# Patient Record
Sex: Female | Born: 1980 | Race: White | Hispanic: No | Marital: Married | Smoking: Never smoker
Health system: Southern US, Community
[De-identification: ages and names within clinical notes are randomized; demographics above are authoritative.]

## PROBLEM LIST (undated history)

## (undated) DIAGNOSIS — F329 Major depressive disorder, single episode, unspecified: Secondary | ICD-10-CM

## (undated) DIAGNOSIS — I10 Essential (primary) hypertension: Secondary | ICD-10-CM

## (undated) DIAGNOSIS — F419 Anxiety disorder, unspecified: Secondary | ICD-10-CM

## (undated) DIAGNOSIS — F32A Depression, unspecified: Secondary | ICD-10-CM

## (undated) HISTORY — DX: Depression, unspecified: F32.A

## (undated) HISTORY — PX: CHOLECYSTECTOMY: SHX55

## (undated) HISTORY — DX: Anxiety disorder, unspecified: F41.9

## (undated) HISTORY — PX: ABDOMINAL SURGERY: SHX537

## (undated) HISTORY — DX: Essential (primary) hypertension: I10

## (undated) HISTORY — PX: VAGINAL HYSTERECTOMY: SHX2639

---
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
## 2009-01-08 ENCOUNTER — Other Ambulatory Visit: Admission: RE | Admit: 2009-01-08 | Discharge: 2009-01-08 | Payer: Self-pay | Admitting: Gynecology

## 2009-01-08 ENCOUNTER — Ambulatory Visit: Payer: Self-pay | Admitting: Gynecology

## 2009-01-08 ENCOUNTER — Encounter: Payer: Self-pay | Admitting: Gynecology

## 2009-01-13 ENCOUNTER — Ambulatory Visit: Payer: Self-pay | Admitting: Gynecology

## 2009-01-15 ENCOUNTER — Ambulatory Visit (HOSPITAL_COMMUNITY): Admission: RE | Admit: 2009-01-15 | Discharge: 2009-01-15 | Payer: Self-pay | Admitting: Gynecology

## 2009-02-05 ENCOUNTER — Inpatient Hospital Stay (HOSPITAL_COMMUNITY): Admission: RE | Admit: 2009-02-05 | Discharge: 2009-02-06 | Payer: Self-pay | Admitting: General Surgery

## 2009-02-05 ENCOUNTER — Encounter (INDEPENDENT_AMBULATORY_CARE_PROVIDER_SITE_OTHER): Payer: Self-pay | Admitting: General Surgery

## 2010-05-31 ENCOUNTER — Encounter: Payer: Self-pay | Admitting: Gynecology

## 2010-08-13 LAB — GRAM STAIN

## 2010-08-13 LAB — CBC
HCT: 37.5 % (ref 36.0–46.0)
MCV: 85 fL (ref 78.0–100.0)
Platelets: 286 10*3/uL (ref 150–400)
RBC: 4.41 MIL/uL (ref 3.87–5.11)
WBC: 7.6 10*3/uL (ref 4.0–10.5)

## 2010-08-13 LAB — BASIC METABOLIC PANEL
BUN: 6 mg/dL (ref 6–23)
Chloride: 104 mEq/L (ref 96–112)
GFR calc Af Amer: >60 mL/min (ref >60)
GFR calc non Af Amer: >60 mL/min (ref >60)
Potassium: 3.4 mEq/L — ABNORMAL LOW (ref 3.5–5.1)

## 2010-08-13 LAB — DIFFERENTIAL
Eosinophils Absolute: 0.1 10*3/uL (ref 0.0–0.7)
Eosinophils Relative: 1 % (ref 0–5)
Lymphocytes Relative: 28 % (ref 12–46)
Lymphs Abs: 2.1 10*3/uL (ref 0.7–4.0)
Monocytes Relative: 5 % (ref 3–12)
Neutrophils Relative %: 65 % (ref 43–77)

## 2010-08-13 LAB — ANAEROBIC CULTURE: Gram Stain: NONE SEEN

## 2010-08-13 LAB — HCG, SERUM, QUALITATIVE: Preg, Serum: NEGATIVE

## 2010-10-28 ENCOUNTER — Emergency Department (HOSPITAL_COMMUNITY)
Admission: EM | Admit: 2010-10-28 | Discharge: 2010-10-28 | Discharge status: Against Medical Advice | Payer: Medicaid Other | Attending: Emergency Medicine | Admitting: Emergency Medicine

## 2010-10-28 DIAGNOSIS — R110 Nausea: Secondary | ICD-10-CM | POA: Insufficient documentation

## 2010-10-28 DIAGNOSIS — R109 Unspecified abdominal pain: Secondary | ICD-10-CM | POA: Insufficient documentation

## 2010-10-28 DIAGNOSIS — R51 Headache: Secondary | ICD-10-CM | POA: Insufficient documentation

## 2010-10-28 LAB — URINALYSIS, ROUTINE W REFLEX MICROSCOPIC
Bilirubin Urine: NEGATIVE
Ketones, ur: NEGATIVE mg/dL
Leukocytes, UA: NEGATIVE
Nitrite: NEGATIVE
Protein, ur: NEGATIVE mg/dL
Urobilinogen, UA: 0.2 mg/dL (ref 0.0–1.0)

## 2010-10-28 LAB — POCT PREGNANCY, URINE: Preg Test, Ur: NEGATIVE

## 2013-11-12 ENCOUNTER — Encounter (INDEPENDENT_AMBULATORY_CARE_PROVIDER_SITE_OTHER): Payer: Self-pay | Admitting: General Surgery

## 2013-12-13 ENCOUNTER — Encounter (INDEPENDENT_AMBULATORY_CARE_PROVIDER_SITE_OTHER): Payer: Self-pay | Admitting: General Surgery

## 2013-12-19 ENCOUNTER — Encounter (INDEPENDENT_AMBULATORY_CARE_PROVIDER_SITE_OTHER): Payer: Self-pay | Admitting: General Surgery

## 2017-06-05 ENCOUNTER — Other Ambulatory Visit: Payer: Self-pay | Admitting: Specialist

## 2017-06-05 DIAGNOSIS — N63 Unspecified lump in unspecified breast: Secondary | ICD-10-CM

## 2017-06-05 DIAGNOSIS — R1084 Generalized abdominal pain: Secondary | ICD-10-CM

## 2017-06-07 ENCOUNTER — Other Ambulatory Visit: Payer: Self-pay

## 2017-06-09 ENCOUNTER — Other Ambulatory Visit: Payer: Self-pay

## 2017-06-12 ENCOUNTER — Ambulatory Visit
Admission: RE | Admit: 2017-06-12 | Discharge: 2017-06-12 | Disposition: A | Discharge status: Home / Self Care | Payer: BLUE CROSS/BLUE SHIELD | Source: Ambulatory Visit | Attending: Specialist | Admitting: Specialist

## 2017-06-12 DIAGNOSIS — R1084 Generalized abdominal pain: Secondary | ICD-10-CM

## 2017-06-12 DIAGNOSIS — N63 Unspecified lump in unspecified breast: Secondary | ICD-10-CM

## 2017-06-13 ENCOUNTER — Other Ambulatory Visit: Payer: Self-pay | Admitting: Specialist

## 2017-06-13 DIAGNOSIS — R1900 Intra-abdominal and pelvic swelling, mass and lump, unspecified site: Secondary | ICD-10-CM

## 2017-06-20 ENCOUNTER — Ambulatory Visit
Admission: RE | Admit: 2017-06-20 | Discharge: 2017-06-20 | Disposition: A | Discharge status: Home / Self Care | Payer: BLUE CROSS/BLUE SHIELD | Source: Ambulatory Visit | Attending: Specialist | Admitting: Specialist

## 2017-06-20 DIAGNOSIS — R1900 Intra-abdominal and pelvic swelling, mass and lump, unspecified site: Secondary | ICD-10-CM

## 2017-08-10 ENCOUNTER — Other Ambulatory Visit: Payer: Self-pay | Admitting: Orthopedic Surgery

## 2017-08-10 DIAGNOSIS — M5412 Radiculopathy, cervical region: Secondary | ICD-10-CM

## 2017-08-18 ENCOUNTER — Ambulatory Visit
Admission: RE | Admit: 2017-08-18 | Discharge: 2017-08-18 | Disposition: A | Discharge status: Home / Self Care | Payer: BLUE CROSS/BLUE SHIELD | Source: Ambulatory Visit | Attending: Orthopedic Surgery | Admitting: Orthopedic Surgery

## 2017-08-18 DIAGNOSIS — M5412 Radiculopathy, cervical region: Secondary | ICD-10-CM

## 2017-11-08 ENCOUNTER — Other Ambulatory Visit: Payer: Self-pay | Admitting: General Surgery

## 2017-11-08 DIAGNOSIS — R222 Localized swelling, mass and lump, trunk: Secondary | ICD-10-CM

## 2018-11-07 ENCOUNTER — Encounter: Payer: Self-pay | Admitting: Gastroenterology

## 2018-11-07 ENCOUNTER — Other Ambulatory Visit: Payer: Self-pay

## 2018-11-07 ENCOUNTER — Telehealth (INDEPENDENT_AMBULATORY_CARE_PROVIDER_SITE_OTHER): Payer: BC Managed Care – PPO | Admitting: Gastroenterology

## 2018-11-07 VITALS — Ht 66.0 in | Wt 300.0 lb

## 2018-11-07 DIAGNOSIS — K219 Gastro-esophageal reflux disease without esophagitis: Secondary | ICD-10-CM

## 2018-11-07 DIAGNOSIS — K625 Hemorrhage of anus and rectum: Secondary | ICD-10-CM | POA: Diagnosis not present

## 2018-11-07 MED ORDER — CLENPIQ 10-3.5-12 MG-GM -GM/160ML PO SOLN: 1.0000 | Freq: Once | ORAL | 0 refills | Status: AC

## 2018-11-07 MED ORDER — PANTOPRAZOLE SODIUM 40 MG PO TBEC: 40.0000 mg | DELAYED_RELEASE_TABLET | Freq: Every day | ORAL | 6 refills | Status: DC

## 2018-11-07 NOTE — Progress Notes (Signed)
Chief Complaint:   Referring Provider:  Raina Mina., MD      ASSESSMENT AND PLAN;   #1. Rectal bleeding (Nl CBC 04/2018)  #2. GERD with occasional dysphagia.  Plan: -Protonix 40mg  po qd. -Proceed with EGD/colon with clenpiq.  We will perform esophageal dilatation if needed.  Discussed risks & benefits. (Risks including rare perforation req laparotomy, bleeding after biopsies/polypectomy req blood transfusion, rare chance of missing neoplasms, risks of anesthesia/sedation). Benefits outweigh the risks. Patient agrees to proceed. All the questions were answered.     HPI:    Mary Hanna is a 38 y.o. female  1 year history of intermittent rectal bleeding, at times blood mixed with the stool. Over the last few weeks she also has been having some rectal pain. Denies having any abdominal pain.  No fever or chills.  Has been having significant heartburn despite over-the-counter medications.  Has occasional dysphagia mostly to solids.  Mostly in the mid chest, occurs rarely, mostly with dry meats.  No odynophagia.  No melena.  No recent weight loss.  In fact she has gained 70 pounds over the last year.  Has been seen by Dr. Bea Graff.  Has been advised to get endoscopic evaluation by means of EGD and colonoscopy.  No nonsteroidals.  Husband - s/p liver transplant 58yrs ago, was pt of ours Past Medical History:  Diagnosis Date  . Anxiety   . Depression   . HTN (hypertension)     Past Surgical History:  Procedure Laterality Date  . ABDOMINAL SURGERY    . CESAREAN SECTION    . CHOLECYSTECTOMY    . VAGINAL HYSTERECTOMY      Family History  Problem Relation Age of Onset  . Colon cancer Neg Hx     Social History   Tobacco Use  . Smoking status: Never Smoker  . Smokeless tobacco: Never Used  Substance Use Topics  . Alcohol use: Never    Frequency: Never  . Drug use: Not on file    Current Outpatient Medications  Medication Sig Dispense Refill  . Ascorbic  Acid (VITAMIN C) 100 MG tablet Take 100 mg by mouth daily.    . cholecalciferol (VITAMIN D3) 25 MCG (1000 UT) tablet Take 1,000 Units by mouth daily.    Marland Kitchen escitalopram (LEXAPRO) 20 MG tablet Take 20 mg by mouth at bedtime.    Marland Kitchen loratadine (CLARITIN) 10 MG tablet Take 10 mg by mouth daily.    Marland Kitchen losartan (COZAAR) 50 MG tablet Take 50 mg by mouth at bedtime.     No current facility-administered medications for this visit.     Not on File  Review of Systems:  Constitutional: Denies fever, chills, diaphoresis, appetite change and fatigue.  HEENT: Denies photophobia, eye pain, redness, hearing loss, ear pain, congestion, sore throat, rhinorrhea, sneezing, mouth sores, neck pain, neck stiffness and tinnitus.   Respiratory: Denies SOB, DOE, cough, chest tightness,  and wheezing.   Cardiovascular: Denies chest pain, palpitations and leg swelling.  Genitourinary: Denies dysuria, urgency, frequency, hematuria, flank pain and difficulty urinating.  Musculoskeletal: Denies myalgias, back pain, joint swelling, arthralgias and gait problem.  Skin: No rash.  Neurological: Denies dizziness, seizures, syncope, weakness, light-headedness, numbness and headaches.  Hematological: Denies adenopathy. Easy bruising, personal or family bleeding history  Psychiatric/Behavioral: has anxiety or depression     Physical Exam:    Ht 5\' 6"  (1.676 m)   Wt 300 lb (136.1 kg)   BMI 48.42 kg/m  [employer]  11/07/18 1226  Weight: 300 lb (136.1 kg)   Constitutional:  Well-developed, in no acute distress. Psychiatric: Normal mood and affect. Behavior is normal. Doxy-visit  Data Reviewed: I have personally reviewed following labs and imaging studies  CBC: CBC Latest Ref Rng & Units 02/02/2009  WBC 4.0 - 10.5 K/uL 7.6  Hemoglobin 12.0 - 15.0 g/dL 12.9  Hematocrit 36.0 - 46.0 % 37.5  Platelets 150 - 400 K/uL 286    CMP: CMP Latest Ref Rng & Units 02/02/2009  Glucose 70 - 99 mg/dL 117(H)  BUN 6 - 23 mg/dL  6  Creatinine 0.4 - 1.2 mg/dL 0.57  Sodium 135 - 145 mEq/L 138  Potassium 3.5 - 5.1 mEq/L 3.4(L)  Chloride 96 - 112 mEq/L 104  CO2 19 - 32 mEq/L 29  Calcium 8.4 - 10.5 mg/dL 9.4   This service was provided via doxy-visit.  The patient was located at home.  The provider was located in office.  The patient did consent to this telephone visit and is aware of possible charges through their insurance for this visit.  The patient was referred by Dr. Bea Graff.    I also saw her husband over video and said Hi.   Time spent on call/coordination of care: 30 min    Carmell Austria, MD 11/07/2018, 2:29 PM  Cc: Raina Mina., MD

## 2018-11-07 NOTE — Patient Instructions (Addendum)
If you are age 38 or older, your body mass index should be between 23-30. Your Body mass index is 48.42 kg/m. If this is out of the aforementioned range listed, please consider follow up with your Primary Care Provider.  If you are age 100 or younger, your body mass index should be between 19-25. Your Body mass index is 48.42 kg/m. If this is out of the aformentioned range listed, please consider follow up with your Primary Care Provider.   We have sent the following medications to your pharmacy for you to pick up at your convenience: Protonix  Clenpiq  You have been scheduled for a colonoscopy. Please follow written instructions given to you at your visit today.  Please pick up your prep supplies at the pharmacy within the next 1-3 days. If you use inhalers (even only as needed), please bring them with you on the day of your procedure. Your physician has requested that you go to www.startemmi.com and enter the access code given to you at your visit today. This web site gives a general overview about your procedure. However, you should still follow specific instructions given to you by our office regarding your preparation for the procedure.    Thank you,  Dr. Jackquline Denmark

## 2018-11-15 ENCOUNTER — Telehealth: Payer: Self-pay | Admitting: Gastroenterology

## 2018-11-15 NOTE — Telephone Encounter (Signed)
I have called and lmom for patient to return my call.

## 2018-11-15 NOTE — Telephone Encounter (Signed)
I have spoke to patient she's going to come to the [facility] office to pick up a sample.

## 2018-11-15 NOTE — Telephone Encounter (Signed)
Pt called stating that her insurance does not cover clenpiq. She wants to know if we can give her something different.

## 2018-11-21 ENCOUNTER — Telehealth: Payer: Self-pay | Admitting: Gastroenterology

## 2018-11-21 NOTE — Telephone Encounter (Signed)

## 2018-11-22 ENCOUNTER — Encounter: Payer: Self-pay | Admitting: Gastroenterology

## 2018-11-22 ENCOUNTER — Ambulatory Visit (AMBULATORY_SURGERY_CENTER): Payer: BC Managed Care – PPO | Admitting: Gastroenterology

## 2018-11-22 ENCOUNTER — Other Ambulatory Visit: Payer: Self-pay

## 2018-11-22 VITALS — BP 136/89 | HR 76 | Temp 97.2°F | Resp 10 | Ht 66.0 in | Wt 300.0 lb

## 2018-11-22 DIAGNOSIS — K621 Rectal polyp: Secondary | ICD-10-CM

## 2018-11-22 DIAGNOSIS — K219 Gastro-esophageal reflux disease without esophagitis: Secondary | ICD-10-CM

## 2018-11-22 DIAGNOSIS — R1310 Dysphagia, unspecified: Secondary | ICD-10-CM | POA: Diagnosis not present

## 2018-11-22 DIAGNOSIS — K2970 Gastritis, unspecified, without bleeding: Secondary | ICD-10-CM

## 2018-11-22 DIAGNOSIS — D128 Benign neoplasm of rectum: Secondary | ICD-10-CM

## 2018-11-22 DIAGNOSIS — D129 Benign neoplasm of anus and anal canal: Secondary | ICD-10-CM | POA: Diagnosis not present

## 2018-11-22 DIAGNOSIS — K625 Hemorrhage of anus and rectum: Secondary | ICD-10-CM

## 2018-11-22 MED ORDER — SODIUM CHLORIDE 0.9 % IV SOLN: 500.0000 mL | Freq: Once | INTRAVENOUS | Status: AC

## 2018-11-22 MED ORDER — HYDROCORTISONE (PERIANAL) 2.5 % EX CREA: 1.0000 "application " | TOPICAL_CREAM | Freq: Two times a day (BID) | CUTANEOUS | 4 refills | Status: AC

## 2018-11-22 NOTE — Progress Notes (Signed)
To PACU, VSS. Report to [email or web address] 

## 2018-11-22 NOTE — Progress Notes (Signed)
Pt. Complains of her lower lip is sore.  Right side of lower lip has some swelling (1+)  No bleeding or discoloration noted.  Reviewed with pt. The benefits of her visit/procedure with Dr. Lyndel Safe, and that the swelling/discomfort should improve over the next few days.  Apply ice if that helps with discomfort at home.

## 2018-11-22 NOTE — Patient Instructions (Addendum)
Impression/Recommendations:  Gastritis handout given to patient. Dilation diet handout given to patient. Hemorrhoid handout given to patient.  Continue Protonix 40 mg by mouth once daily. Chew food especially meats and breads well and eat slowly.  HC 2.5% Cream:  Apply one applicator kit externally to rectum Use 2 times daily for 2 weeks.  Await pathology results. Resume previous diet. Continue present medications.  Repeat colonoscopy for surveillance.  Date to be determined based on pathology results.  YOU HAD AN ENDOSCOPIC PROCEDURE TODAY AT [facility]:   Refer to the procedure report that was given to you for any specific questions about what was found during the examination.  If the procedure report does not answer your questions, please call your gastroenterologist to clarify.  If you requested that your care partner not be given the details of your procedure findings, then the procedure report has been included in a sealed envelope for you to review at your convenience later.  YOU SHOULD EXPECT: Some feelings of bloating in the abdomen. Passage of more gas than usual.  Walking can help get rid of the air that was put into your GI tract during the procedure and reduce the bloating. If you had a lower endoscopy (such as a colonoscopy or flexible sigmoidoscopy) you may notice spotting of blood in your stool or on the toilet paper. If you underwent a bowel prep for your procedure, you may not have a normal bowel movement for a few days.  Please Note:  You might notice some irritation and congestion in your nose or some drainage.  This is from the oxygen used during your procedure.  There is no need for concern and it should clear up in a day or so.  SYMPTOMS TO REPORT IMMEDIATELY:   Following lower endoscopy (colonoscopy or flexible sigmoidoscopy):  Excessive amounts of blood in the stool  Significant tenderness or worsening of abdominal pains  Swelling of the abdomen  that is new, acute  Fever of 100F or higher   Following upper endoscopy (EGD)  Vomiting of blood or coffee ground material  New chest pain or pain under the shoulder blades  Painful or persistently difficult swallowing  New shortness of breath  Fever of 100F or higher  Black, tarry-looking stools  For urgent or emergent issues, a gastroenterologist can be reached at any hour by calling [phone].   DIET:  We do recommend a small meal at first, but then you may proceed to your regular diet.  Drink plenty of fluids but you should avoid alcoholic beverages for 24 hours.  ACTIVITY:  You should plan to take it easy for the rest of today and you should NOT DRIVE or use heavy machinery until tomorrow (because of the sedation medicines used during the test).    FOLLOW UP: Our staff will call the number listed on your records 48-72 hours following your procedure to check on you and address any questions or concerns that you may have regarding the information given to you following your procedure. If we do not reach you, we will leave a message.  We will attempt to reach you two times.  During this call, we will ask if you have developed any symptoms of COVID 19. If you develop any symptoms (ie: fever, flu-like symptoms, shortness of breath, cough etc.) before then, please call [phone].  If you test positive for Covid 19 in the 2 weeks post procedure, please call and report this information to [place].  If any biopsies were taken you will be contacted by phone or by letter within the next 1-3 weeks.  Please call us at [phone] if you have not heard about the biopsies in 3 weeks.    SIGNATURES/CONFIDENTIALITY: You and/or your care partner have signed paperwork which will be entered into your electronic medical record.  These signatures attest to the fact that that the information above on your After Visit Summary has been reviewed and is understood.  Full responsibility of the  confidentiality of this discharge information lies with you and/or your care-partner.

## 2018-11-22 NOTE — Progress Notes (Signed)
Called to room to assist during endoscopic procedure.  Patient ID and intended procedure confirmed with present staff. Received instructions for my participation in the procedure from the performing physician.  

## 2018-11-22 NOTE — Op Note (Signed)
[place] Patient Name: Mary Hanna Procedure Date: 11/22/2018 2:46 PM MRN: [id number] Endoscopist: Jackquline Denmark , MD Age: 38 Referring MD:  Date of Birth: Apr 24, 1981 Gender: Female Account #: [id number] Procedure:                Upper GI endoscopy Indications:              Dysphagia, GERD Medicines:                Monitored Anesthesia Care Procedure:                Pre-Anesthesia Assessment:                           - Prior to the procedure, a History and Physical                            was performed, and patient medications and                            allergies were reviewed. The patient's tolerance of                            previous anesthesia was also reviewed. The risks                            and benefits of the procedure and the sedation                            options and risks were discussed with the patient.                            All questions were answered, and informed consent                            was obtained. Prior Anticoagulants: The patient has                            taken no previous anticoagulant or antiplatelet                            agents. ASA Grade Assessment: II - A patient with                            mild systemic disease. After reviewing the risks                            and benefits, the patient was deemed in                            satisfactory condition to undergo the procedure.                           After obtaining informed consent, the endoscope was  passed under direct vision. Throughout the                            procedure, the patient's blood pressure, pulse, and                            oxygen saturations were monitored continuously. The                            Endoscope was introduced through the mouth, and                            advanced to the second part of duodenum. The upper                            GI endoscopy was accomplished without  difficulty.                            The patient tolerated the procedure well. Scope In: Scope Out: Findings:                 The examined esophagus was normal. Biopsies were                            obtained from the proximal and distal esophagus                            with cold forceps for histology of suspected                            eosinophilic esophagitis.                           The Z-line was regular and was found 38 cm from the                            incisors. The scope was withdrawn. Dilation was                            performed with a Maloney dilator with mild                            resistance at 50 Fr.                           Localized mild inflammation characterized by                            erythema was found in the gastric antrum. Biopsies                            were taken with a cold forceps for histology.                           The examined  duodenum was normal. Biopsies for                            histology were taken with a cold forceps for                            evaluation of celiac disease. Complications:            No immediate complications. Estimated Blood Loss:     Estimated blood loss: none. Impression:               -Mild gastritis.                           -S/P empiric esophageal dilatation. Recommendation:           - Patient has a contact number available for                            emergencies. The signs and symptoms of potential                            delayed complications were discussed with the                            patient. Return to normal activities tomorrow.                            Written discharge instructions were provided to the                            patient.                           - Postdilatation diet.                           - Continue Protonix 40 mg p.o. once a day.                           - Chew food especially meats and breads well and                            eat  slowly.                           - Await pathology results. Jackquline Denmark, MD 11/22/2018 3:26:10 PM This report has been signed electronically.

## 2018-11-22 NOTE — Op Note (Signed)
[facility] Hanna Patient Name: Mary Hanna Procedure Date: 11/22/2018 2:46 PM MRN: [id number] Endoscopist: Jackquline Denmark , MD Age: 38 Referring MD:  Date of Birth: [birth year] Gender: Female Account #: [id number] Procedure:                Colonoscopy Indications:              Rectal bleeding Medicines:                Monitored Anesthesia Care Procedure:                Pre-Anesthesia Assessment:                           - Prior to the procedure, a History and Physical                            was performed, and patient medications and                            allergies were reviewed. The patient's tolerance of                            previous anesthesia was also reviewed. The risks                            and benefits of the procedure and the sedation                            options and risks were discussed with the patient.                            All questions were answered, and informed consent                            was obtained. Prior Anticoagulants: The patient has                            taken no previous anticoagulant or antiplatelet                            agents. ASA Grade Assessment: II - A patient with                            mild systemic disease. After reviewing the risks                            and benefits, the patient was deemed in                            satisfactory condition to undergo the procedure.                           After obtaining informed consent, the colonoscope  was passed under direct vision. Throughout the                            procedure, the patient's blood pressure, pulse, and                            oxygen saturations were monitored continuously. The                            Model PCF-[id number]) scope was introduced                            through the anus and advanced to the 2 cm into the                            ileum. The colonoscopy was performed  without                            difficulty. The patient tolerated the procedure                            well. The quality of the bowel preparation was good                            except in the cecum where there was adherent stool                            which could not be fully washed despite aggressive                            suctioning and aspiration. The terminal ileum,                            ileocecal valve, appendiceal orifice, and rectum                            were photographed. Scope In: 2:59:57 PM Scope Out: 3:18:59 PM Scope Withdrawal Time: 0 hours 17 minutes 11 seconds  Total Procedure Duration: 0 hours 19 minutes 2 seconds  Findings:                 A 4 mm polyp was found in the rectum. The polyp was                            sessile. The polyp was removed with a cold snare.                            Resection and retrieval were complete. Estimated                            blood loss: none.                           A 6 mm polyp  was found in the anal canal, just                            below the dentate line (anterior rectum, confirmed                            on rectal exam). The polyp was sessile. The polyp                            was removed with a cold biopsy forceps. Resection                            and retrieval were complete.                           Non-bleeding internal hemorrhoids were found during                            retroflexion. The hemorrhoids were small.                           The terminal ileum appeared normal.                           The exam was otherwise without abnormality on                            direct and retroflexion views. Complications:            No immediate complications. Estimated Blood Loss:     Estimated blood loss: none. Impression:               -Rectal polyp s/p polypectomy.                           -Anorectal polyp s/p polypectomy.                           -Small internal  hemorrhoids. No active bleeding.                           -Otherwise normal colonoscopy to TI. Recommendation:           - Patient has a contact number available for                            emergencies. The signs and symptoms of potential                            delayed complications were discussed with the                            patient. Return to normal activities tomorrow.                            Written discharge instructions were provided to the  patient.                           - Resume previous diet.                           - Continue present medications.                           - Await pathology results.                           - Repeat colonoscopy for surveillance based on                            pathology results.                           - HC 2.5% cream: Apply one applicator kit                            externally to rectum BID for 2 weeks, 4 refills. Jackquline Denmark, MD 11/22/2018 3:33:02 PM This report has been signed electronically.

## 2018-11-26 ENCOUNTER — Telehealth: Payer: Self-pay

## 2018-11-26 NOTE — Telephone Encounter (Signed)
  Follow up Call-  Call back number 11/22/2018  Post procedure Call Back phone  # [phone]  Permission to leave phone message Yes  Some recent data might be hidden     Patient questions:  Do you have a fever, pain , or abdominal swelling? No. Pain Score  0 *  Have you tolerated food without any problems? Yes.    Have you been able to return to your normal activities? Yes.    Do you have any questions about your discharge instructions: Diet   No. Medications  No. Follow up visit  No.  Do you have questions or concerns about your Care? No.  Actions: * If pain score is 4 or above: No action needed, pain <4.  1. Have you developed a fever since your procedure? no  2.   Have you had an respiratory symptoms (SOB or cough) since your procedure? no  3.   Have you tested positive for COVID 19 since your procedure no  4.   Have you had any family members/close contacts diagnosed with the COVID 19 since your procedure?  no   If yes to any of these questions please route to Joylene John, RN and Alphonsa Gin, Therapist, sports.

## 2018-11-27 ENCOUNTER — Encounter: Payer: Self-pay | Admitting: Gastroenterology

## 2019-05-27 ENCOUNTER — Other Ambulatory Visit: Payer: Self-pay | Admitting: Gastroenterology

## 2019-07-04 IMAGING — US US ABDOMEN COMPLETE
1 series · 14 of 25 positions shown · non-contrast
Comparison: 10/28/2010

CLINICAL DATA: Generalized abdominal pain for 2 months

EXAM:
ABDOMEN ULTRASOUND COMPLETE

[Series 1: us abdomen complete · 0.28mm/px · 14 of 63 slices shown]
[im 1/63]
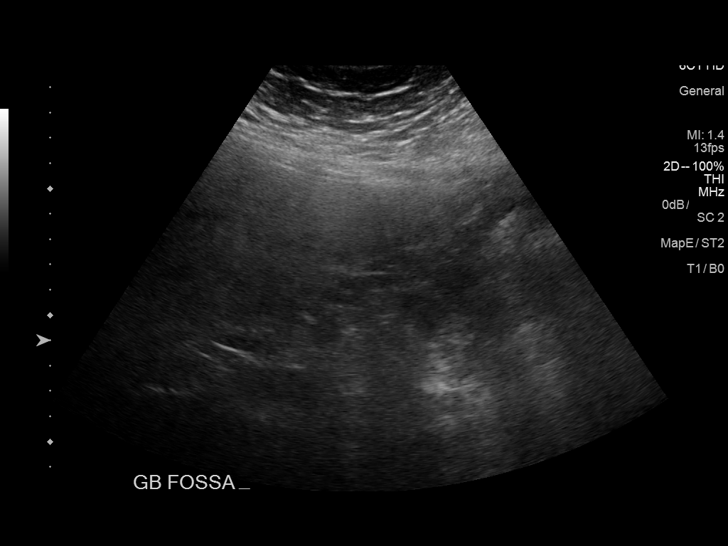
[im 6/63]
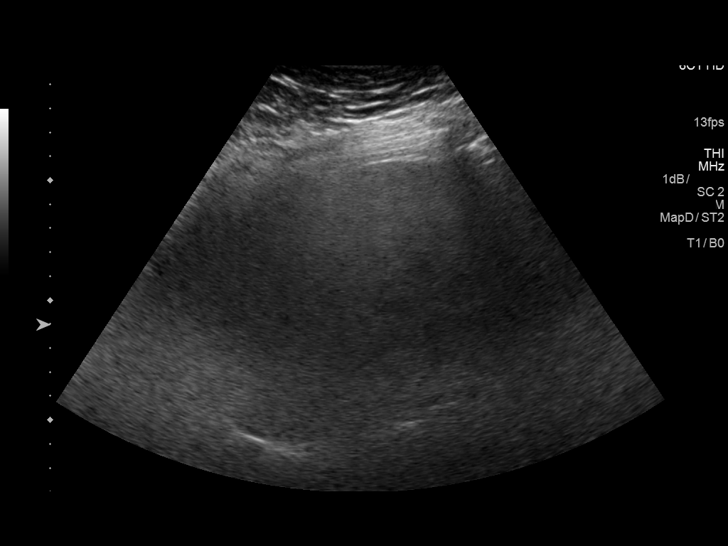
[im 11/63]
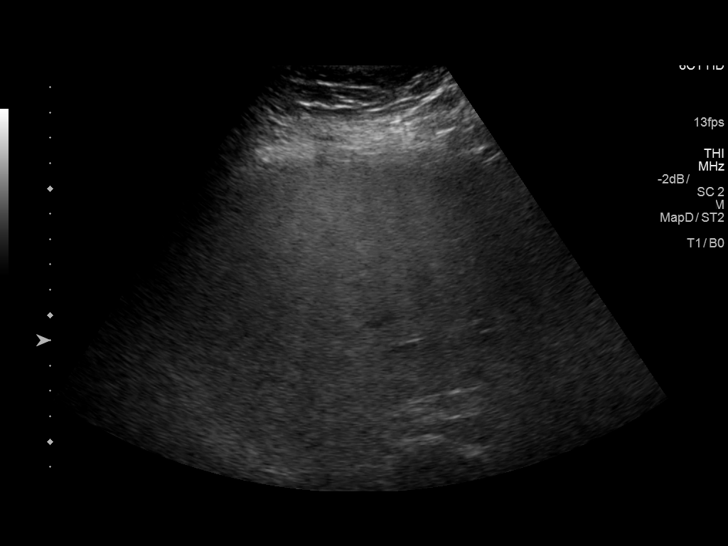
[im 16/63]
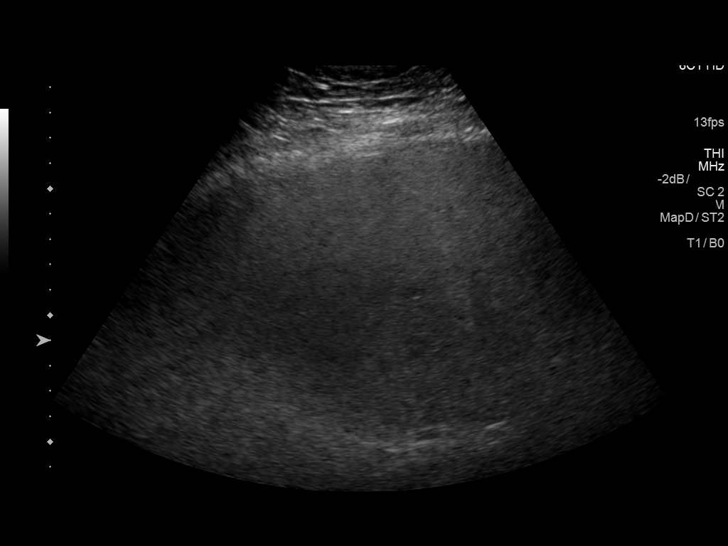
[im 21/63]
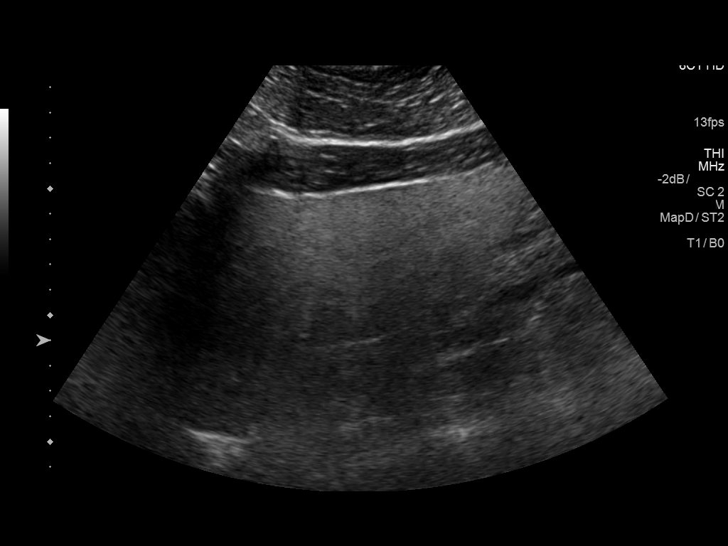
[im 24/63]
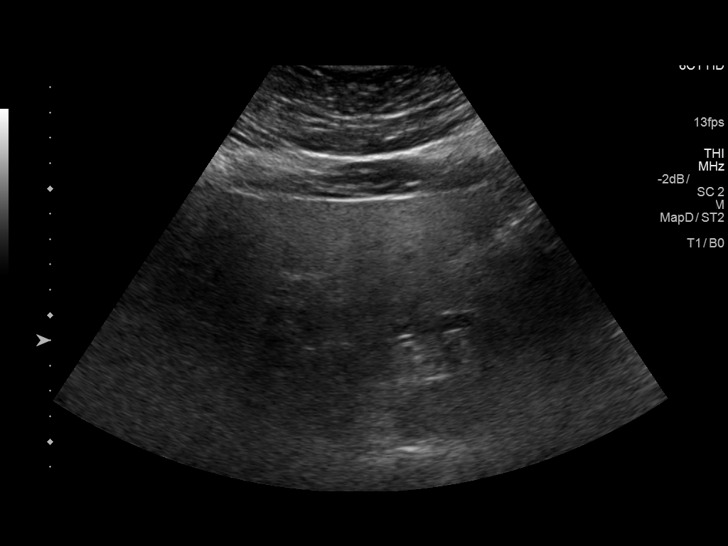
[im 29/63]
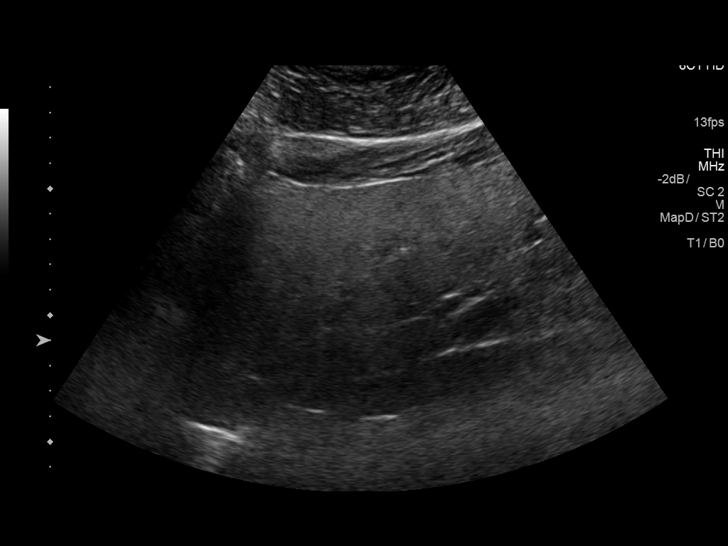
[im 34/63]
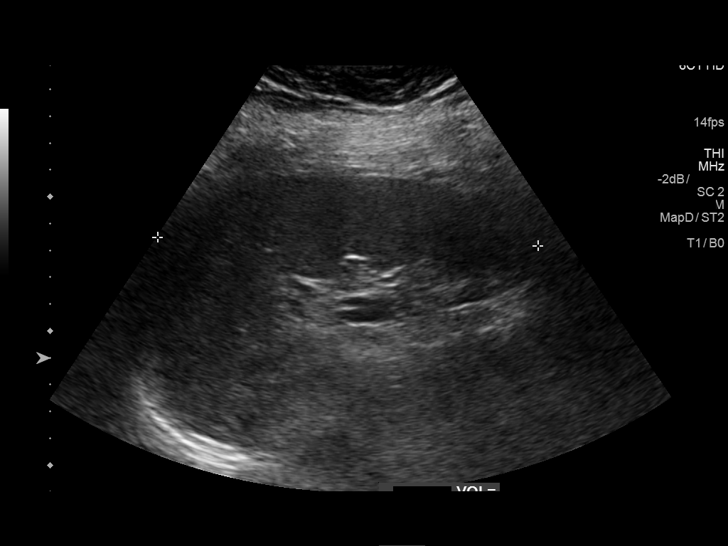
[im 39/63]
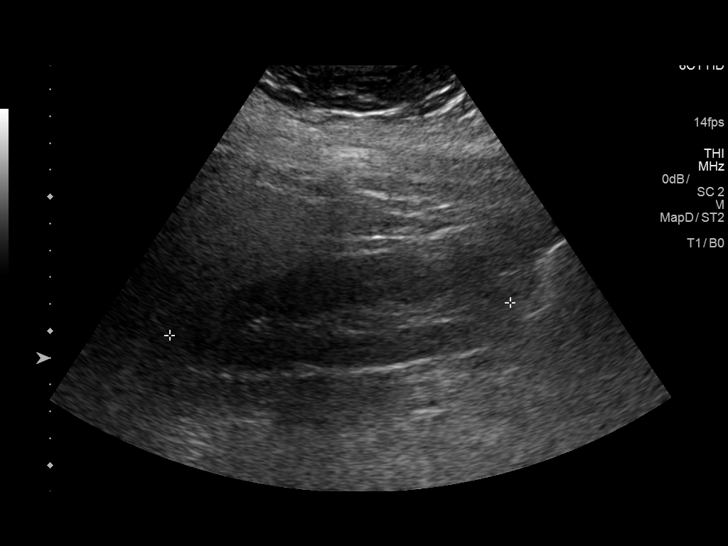
[im 42/63]
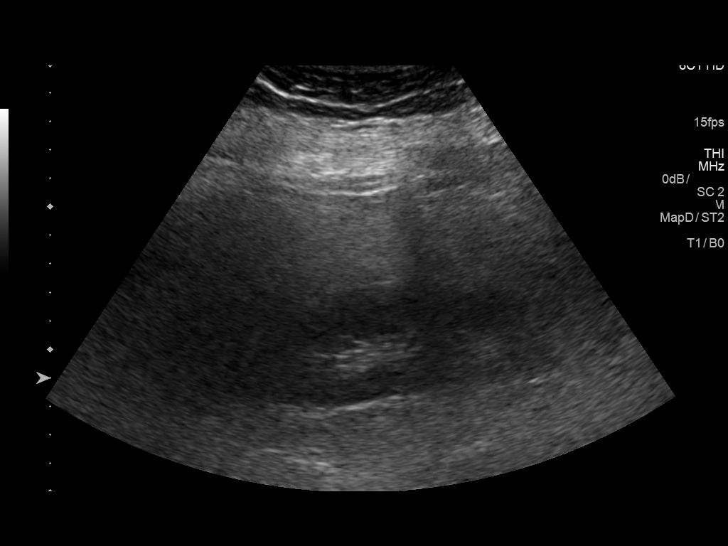
[im 47/63]
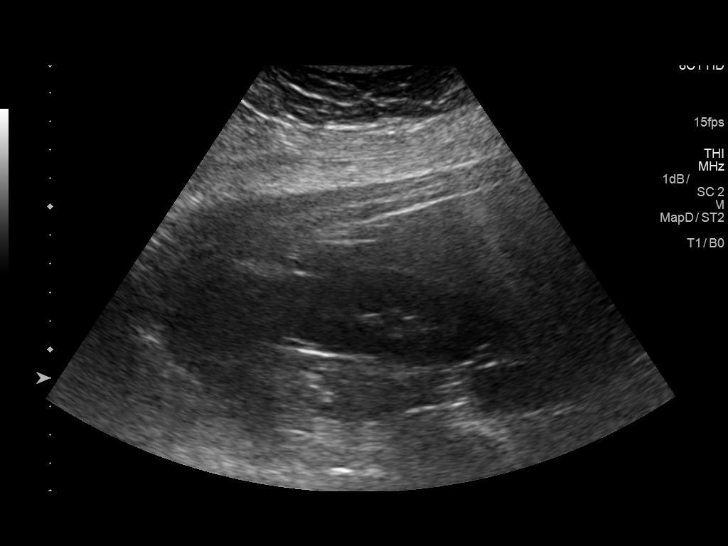
[im 52/63]
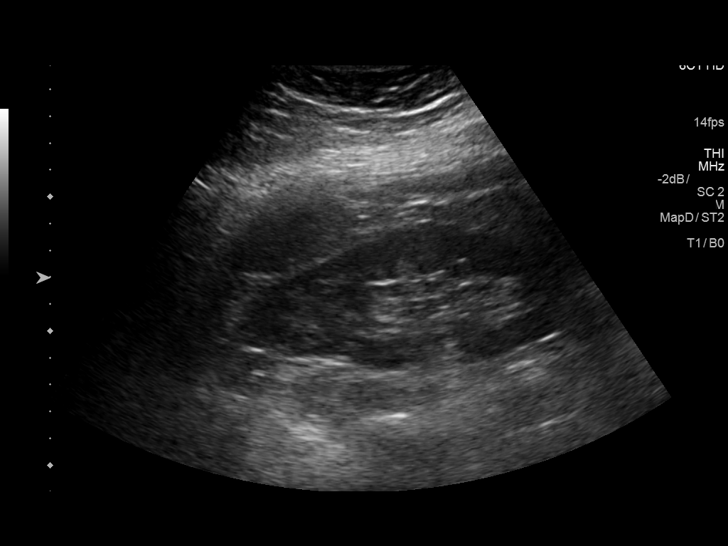
[im 57/63]
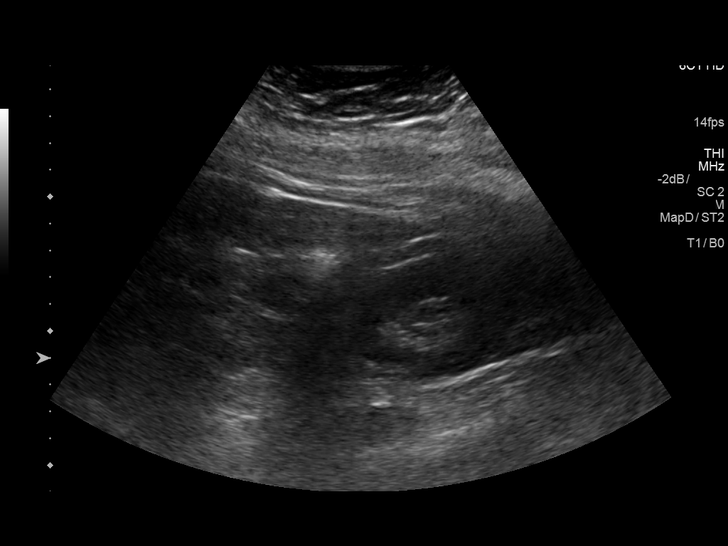
[im 63/63]
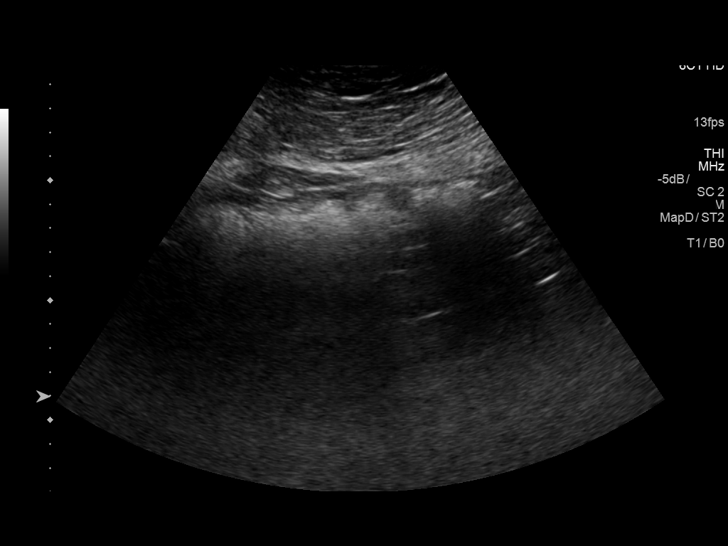

[14 of 25 positions shown; findings below may reference images not displayed]

FINDINGS: Gallbladder: Surgically removed

Common bile duct: Diameter: 5 mm

Liver: Diffusely increased in echogenicity without focal mass.
Portal vein is patent on color Doppler imaging with normal direction
of blood flow towards the liver.

IVC: No abnormality visualized.

Pancreas: Visualized portion unremarkable.

Spleen: Size and appearance within normal limits.

Right Kidney: Length: 12 cm. Echogenicity within normal limits. No
mass or hydronephrosis visualized.

Left Kidney: Length: 13.4 cm. Echogenicity within normal limits. No
mass or hydronephrosis visualized.

Abdominal aorta: No aneurysm visualized.

Other findings: None.
IMPRESSION: Increased echogenicity within the liver consistent with fatty
infiltration.

## 2020-01-27 ENCOUNTER — Other Ambulatory Visit: Payer: Self-pay | Admitting: Gastroenterology

## 2020-05-04 ENCOUNTER — Other Ambulatory Visit: Payer: Self-pay | Admitting: Gastroenterology

## 2020-10-02 ENCOUNTER — Other Ambulatory Visit: Payer: Self-pay | Admitting: Gastroenterology

## 2021-02-04 ENCOUNTER — Other Ambulatory Visit: Payer: Self-pay | Admitting: Specialist

## 2021-02-04 DIAGNOSIS — Z1231 Encounter for screening mammogram for malignant neoplasm of breast: Secondary | ICD-10-CM

## 2021-03-16 ENCOUNTER — Ambulatory Visit: Payer: BC Managed Care – PPO

## 2021-04-21 ENCOUNTER — Ambulatory Visit
Admission: RE | Admit: 2021-04-21 | Discharge: 2021-04-21 | Disposition: A | Discharge status: Home / Self Care | Payer: BC Managed Care – PPO | Source: Ambulatory Visit | Attending: Specialist | Admitting: Specialist

## 2021-04-21 DIAGNOSIS — Z1231 Encounter for screening mammogram for malignant neoplasm of breast: Secondary | ICD-10-CM

## 2022-04-18 ENCOUNTER — Other Ambulatory Visit: Payer: Self-pay | Admitting: Specialist

## 2022-04-18 DIAGNOSIS — Z1231 Encounter for screening mammogram for malignant neoplasm of breast: Secondary | ICD-10-CM

## 2022-05-10 DIAGNOSIS — B3731 Acute candidiasis of vulva and vagina: Secondary | ICD-10-CM | POA: Diagnosis not present

## 2022-05-10 DIAGNOSIS — Z8616 Personal history of COVID-19: Secondary | ICD-10-CM | POA: Diagnosis not present

## 2022-05-10 DIAGNOSIS — J40 Bronchitis, not specified as acute or chronic: Secondary | ICD-10-CM | POA: Diagnosis not present

## 2022-05-31 DIAGNOSIS — I10 Essential (primary) hypertension: Secondary | ICD-10-CM | POA: Diagnosis not present

## 2022-05-31 DIAGNOSIS — R69 Illness, unspecified: Secondary | ICD-10-CM | POA: Diagnosis not present

## 2022-05-31 DIAGNOSIS — R7303 Prediabetes: Secondary | ICD-10-CM | POA: Diagnosis not present

## 2022-05-31 DIAGNOSIS — K219 Gastro-esophageal reflux disease without esophagitis: Secondary | ICD-10-CM | POA: Diagnosis not present

## 2022-06-16 ENCOUNTER — Ambulatory Visit
Admission: RE | Admit: 2022-06-16 | Discharge: 2022-06-16 | Disposition: A | Discharge status: Home / Self Care | Payer: BC Managed Care – PPO | Source: Ambulatory Visit | Attending: Specialist | Admitting: Specialist

## 2022-06-16 DIAGNOSIS — Z1231 Encounter for screening mammogram for malignant neoplasm of breast: Secondary | ICD-10-CM

## 2022-08-08 DIAGNOSIS — R5383 Other fatigue: Secondary | ICD-10-CM | POA: Diagnosis not present

## 2022-08-08 DIAGNOSIS — I10 Essential (primary) hypertension: Secondary | ICD-10-CM | POA: Diagnosis not present

## 2022-08-08 DIAGNOSIS — E782 Mixed hyperlipidemia: Secondary | ICD-10-CM | POA: Diagnosis not present

## 2022-08-31 DIAGNOSIS — Z01419 Encounter for gynecological examination (general) (routine) without abnormal findings: Secondary | ICD-10-CM | POA: Diagnosis not present

## 2023-01-02 DIAGNOSIS — J40 Bronchitis, not specified as acute or chronic: Secondary | ICD-10-CM | POA: Diagnosis not present

## 2023-01-02 DIAGNOSIS — B3731 Acute candidiasis of vulva and vagina: Secondary | ICD-10-CM | POA: Diagnosis not present

## 2023-01-02 DIAGNOSIS — Z20822 Contact with and (suspected) exposure to covid-19: Secondary | ICD-10-CM | POA: Diagnosis not present

## 2023-05-17 ENCOUNTER — Other Ambulatory Visit: Payer: Self-pay | Admitting: Internal Medicine

## 2023-05-17 DIAGNOSIS — Z1231 Encounter for screening mammogram for malignant neoplasm of breast: Secondary | ICD-10-CM

## 2023-06-29 ENCOUNTER — Ambulatory Visit: Payer: 59

## 2023-07-20 ENCOUNTER — Ambulatory Visit
Admission: RE | Admit: 2023-07-20 | Discharge: 2023-07-20 | Disposition: A | Discharge status: Home / Self Care | Payer: 59 | Source: Ambulatory Visit | Attending: Internal Medicine | Admitting: Internal Medicine

## 2023-07-20 DIAGNOSIS — Z1231 Encounter for screening mammogram for malignant neoplasm of breast: Secondary | ICD-10-CM

## 2023-11-07 ENCOUNTER — Other Ambulatory Visit: Payer: Self-pay | Admitting: Family Medicine

## 2023-11-07 DIAGNOSIS — N644 Mastodynia: Secondary | ICD-10-CM

## 2023-12-12 ENCOUNTER — Ambulatory Visit

## 2023-12-12 ENCOUNTER — Other Ambulatory Visit

## 2023-12-12 ENCOUNTER — Ambulatory Visit
Admission: RE | Admit: 2023-12-12 | Discharge: 2023-12-12 | Disposition: A | Discharge status: Home / Self Care | Source: Ambulatory Visit | Attending: Family Medicine | Admitting: Family Medicine

## 2023-12-12 ENCOUNTER — Encounter

## 2023-12-12 DIAGNOSIS — N644 Mastodynia: Secondary | ICD-10-CM

## 2023-12-27 ENCOUNTER — Other Ambulatory Visit

## 2023-12-27 ENCOUNTER — Encounter
